# Patient Record
Sex: Male | Born: 1970 | Race: Black or African American | Hispanic: No | Marital: Single | Smoking: Current every day smoker
Health system: Southern US, Community
[De-identification: ages and names within clinical notes are randomized; demographics above are authoritative.]

## PROBLEM LIST (undated history)

## (undated) DIAGNOSIS — W3400XA Accidental discharge from unspecified firearms or gun, initial encounter: Secondary | ICD-10-CM

## (undated) DIAGNOSIS — S71131A Puncture wound without foreign body, right thigh, initial encounter: Secondary | ICD-10-CM

## (undated) DIAGNOSIS — D869 Sarcoidosis, unspecified: Secondary | ICD-10-CM

## (undated) HISTORY — PX: FRACTURE SURGERY: SHX138

## (undated) HISTORY — PX: EYE SURGERY: SHX253

---
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
## 2020-04-19 ENCOUNTER — Emergency Department
Admission: EM | Admit: 2020-04-19 | Discharge: 2020-04-19 | Disposition: A | Discharge status: Home / Self Care | Payer: Self-pay | Attending: Emergency Medicine | Admitting: Emergency Medicine

## 2020-04-19 ENCOUNTER — Emergency Department: Payer: Self-pay

## 2020-04-19 ENCOUNTER — Other Ambulatory Visit: Payer: Self-pay

## 2020-04-19 DIAGNOSIS — S2231XA Fracture of one rib, right side, initial encounter for closed fracture: Secondary | ICD-10-CM

## 2020-04-19 DIAGNOSIS — S2231XD Fracture of one rib, right side, subsequent encounter for fracture with routine healing: Secondary | ICD-10-CM | POA: Insufficient documentation

## 2020-04-19 DIAGNOSIS — X58XXXD Exposure to other specified factors, subsequent encounter: Secondary | ICD-10-CM | POA: Insufficient documentation

## 2020-04-19 DIAGNOSIS — F17210 Nicotine dependence, cigarettes, uncomplicated: Secondary | ICD-10-CM | POA: Insufficient documentation

## 2020-04-19 HISTORY — DX: Sarcoidosis, unspecified: D86.9

## 2020-04-19 HISTORY — DX: Puncture wound without foreign body, right thigh, initial encounter: S71.131A

## 2020-04-19 MED ORDER — HYDROCODONE-ACETAMINOPHEN 5-325 MG PO TABS: 1.0000 | ORAL_TABLET | Freq: Four times a day (QID) | ORAL | 0 refills | Status: AC | PRN

## 2020-04-19 MED ORDER — NAPROXEN 500 MG PO TABS
500.0000 mg | ORAL_TABLET | Freq: Two times a day (BID) | ORAL | 0 refills | Status: AC
Start: 2020-04-19 — End: ?

## 2020-04-19 NOTE — ED Triage Notes (Signed)
Pt states he injured his right lower rib 2 months ago and has been having pain since, states it worsened today after coughing.
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
## 2020-04-19 NOTE — Discharge Instructions (Signed)
Follow-up with [facility] acute care if any continued problems.  You may take naproxen 500 mg twice daily with food for inflammation and help with pain.  For moderate to severe pain a prescription for hydrocodone with acetaminophen was sent to your pharmacy.  You cannot drive or operate machinery while taking this medication.  You may also use ice to your ribs to help with pain.  Use a pillow to hold against her ribs if you need to cough, sneeze or take deep breaths.

## 2020-04-19 NOTE — ED Notes (Signed)
See triage note  Presents with pain to right lateral rib and mid back area  Pain started about 2 months ago   Then this weekend he stretched and felt a pop to area  Now having increased pain with inspiration

## 2020-10-10 IMAGING — CR DG RIBS W/ CHEST 3+V*R*
1 series · 3 of 3 positions shown · non-contrast
Comparison: None.

CLINICAL DATA: Right rib pain after injury 2 months ago.

EXAM:
RIGHT RIBS AND CHEST - 3+ VIEW

[Series 1: dg ribs unilateral w/chest right · 0.14mm/px · 3 of 3 slices shown]
[im 1/3]
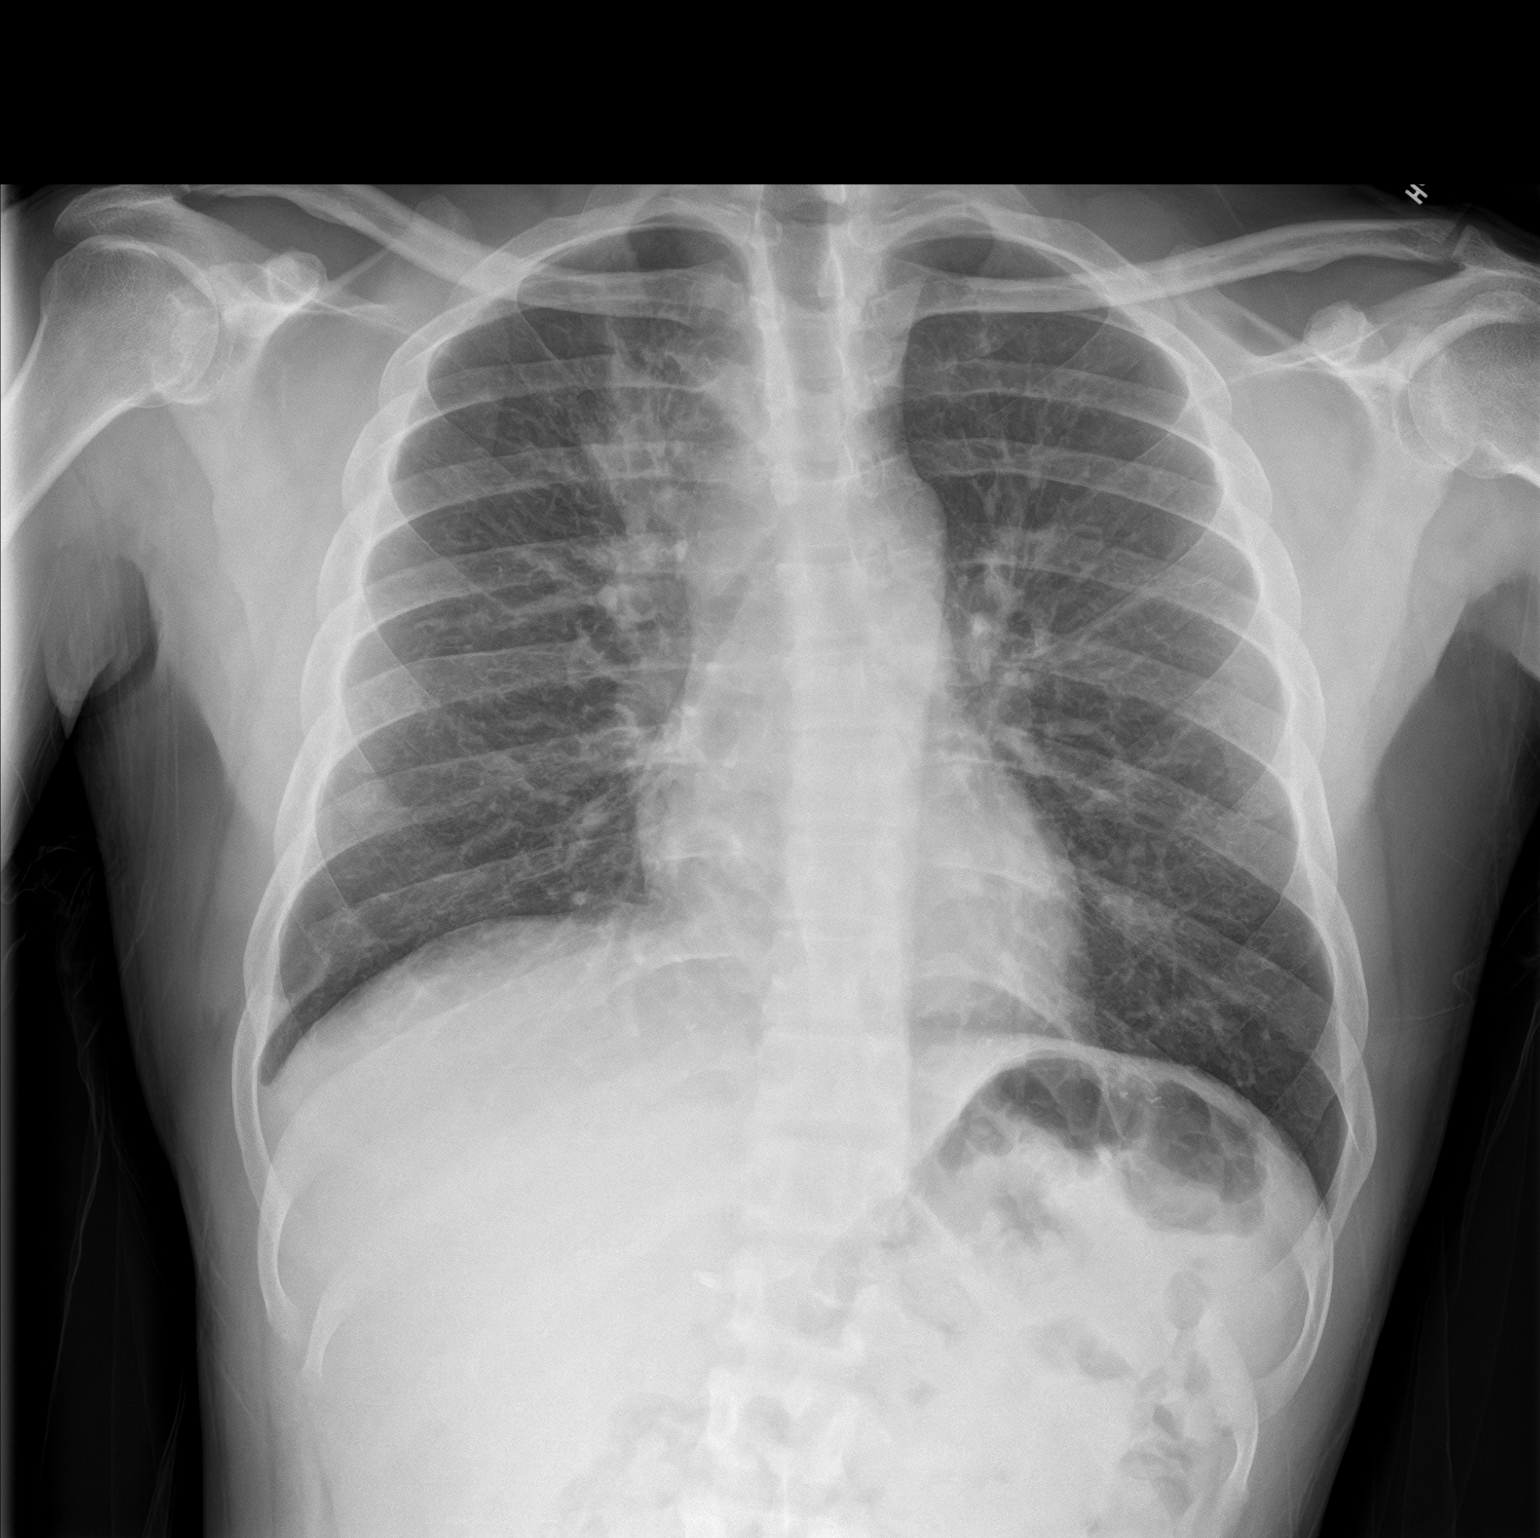
[im 2/3]
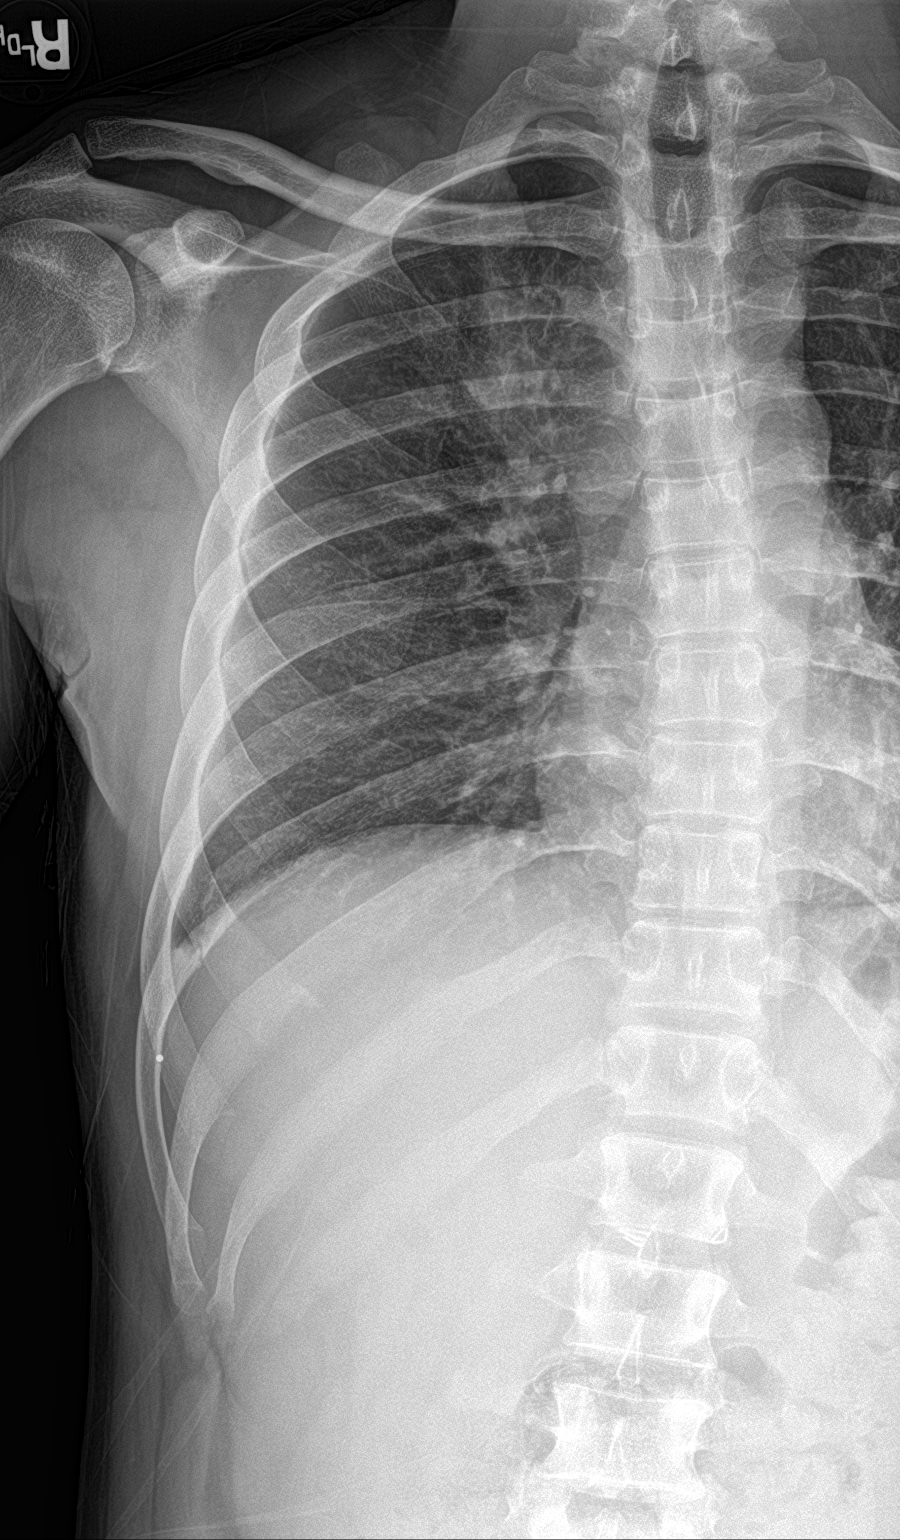
[im 3/3]
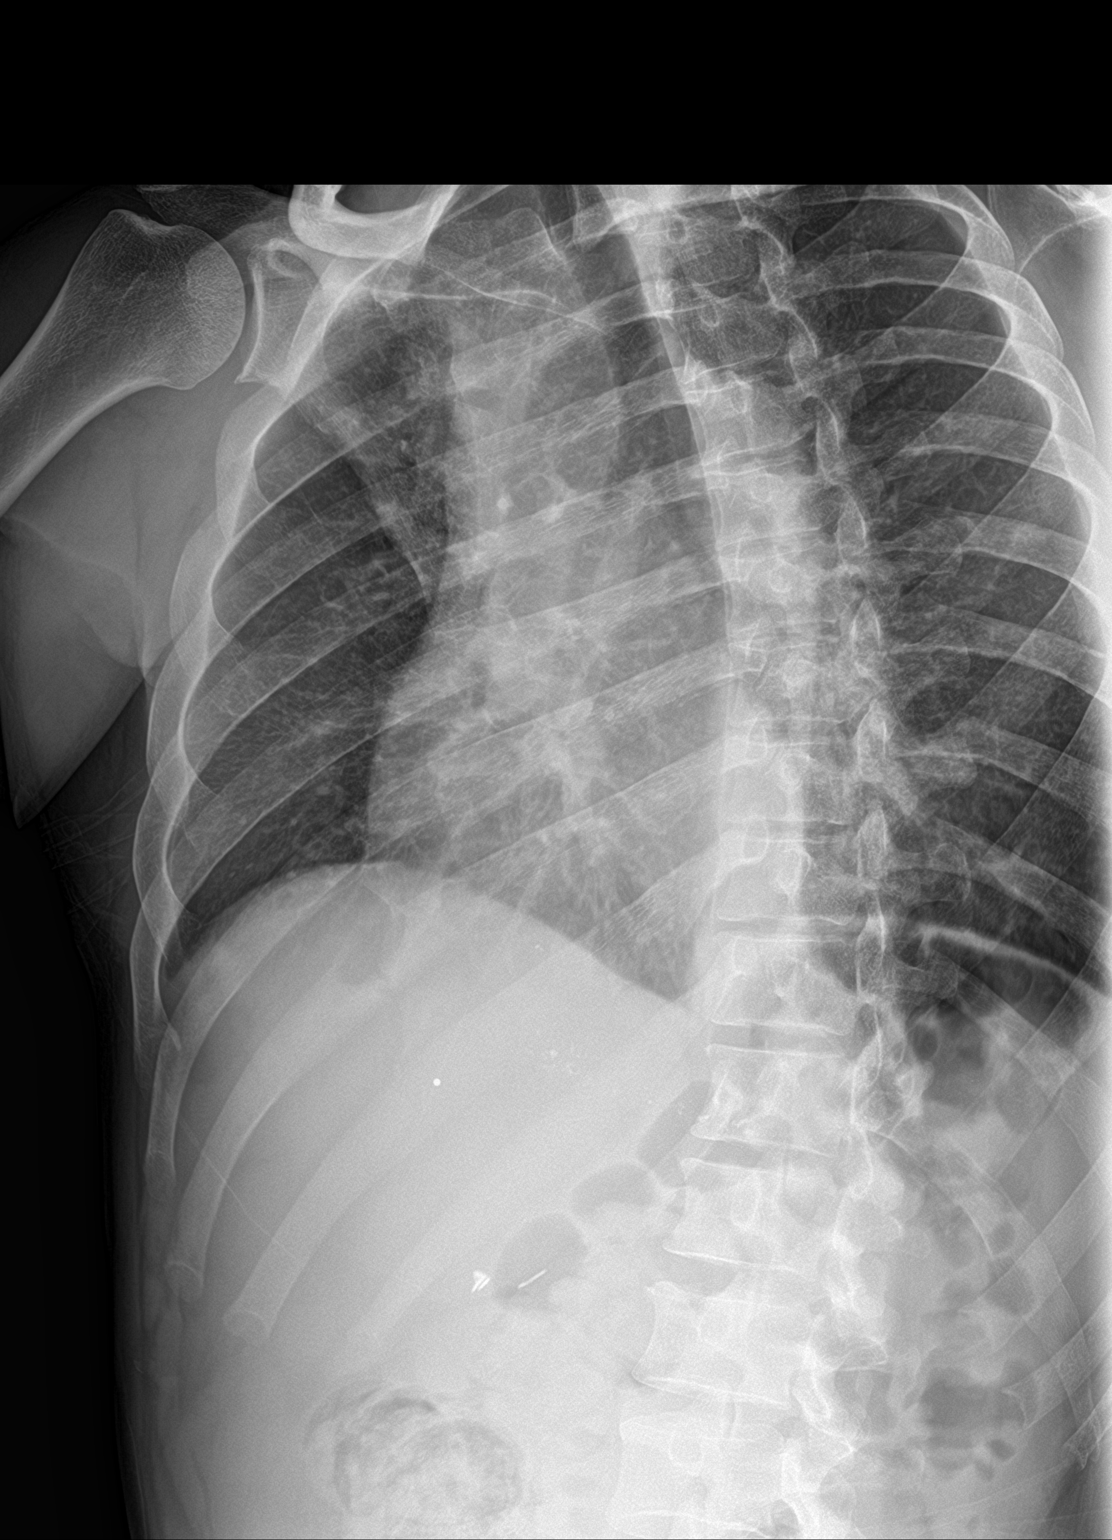

[3 of 3 positions shown; findings below may reference images not displayed]

FINDINGS: Nondisplaced fracture is seen involving the lateral portion of the
right ninth rib. There is no evidence of pneumothorax or pleural
effusion. Both lungs are clear. Heart size and mediastinal contours
are within normal limits.
IMPRESSION: Nondisplaced right ninth rib fracture. No acute cardiopulmonary
abnormality seen.

## 2021-12-03 DEATH — deceased
